# Patient Record
Sex: Female | Born: 1965 | Race: White | Hispanic: No | Marital: Single | Smoking: Never smoker
Health system: Southern US, Community
[De-identification: ages and names within clinical notes are randomized; demographics above are authoritative.]

## PROBLEM LIST (undated history)

## (undated) HISTORY — PX: WISDOM TOOTH EXTRACTION: SHX21

## (undated) HISTORY — PX: INGUINAL HERNIA REPAIR: SUR1180

## (undated) HISTORY — PX: AUGMENTATION MAMMAPLASTY: SUR837

---

## 2010-03-04 ENCOUNTER — Ambulatory Visit (HOSPITAL_COMMUNITY): Admission: RE | Admit: 2010-03-04 | Discharge: 2010-03-04 | Payer: Self-pay | Admitting: Diagnostic Radiology

## 2010-04-14 ENCOUNTER — Encounter: Admission: RE | Admit: 2010-04-14 | Discharge: 2010-04-14 | Payer: Self-pay | Admitting: Obstetrics and Gynecology

## 2010-08-02 ENCOUNTER — Encounter: Payer: Self-pay | Admitting: Obstetrics and Gynecology

## 2013-11-20 ENCOUNTER — Encounter: Payer: Self-pay | Admitting: Obstetrics & Gynecology

## 2013-12-18 ENCOUNTER — Encounter: Payer: Self-pay | Admitting: Obstetrics & Gynecology

## 2013-12-18 DIAGNOSIS — Z0000 Encounter for general adult medical examination without abnormal findings: Secondary | ICD-10-CM

## 2014-02-28 ENCOUNTER — Ambulatory Visit (INDEPENDENT_AMBULATORY_CARE_PROVIDER_SITE_OTHER): Payer: BC Managed Care – PPO | Admitting: Obstetrics & Gynecology

## 2014-02-28 ENCOUNTER — Encounter: Payer: Self-pay | Admitting: Obstetrics & Gynecology

## 2014-02-28 VITALS — BP 116/75 | HR 81 | Resp 16 | Ht 67.0 in | Wt 145.0 lb

## 2014-02-28 DIAGNOSIS — Z1151 Encounter for screening for human papillomavirus (HPV): Secondary | ICD-10-CM | POA: Diagnosis not present

## 2014-02-28 DIAGNOSIS — Z0000 Encounter for general adult medical examination without abnormal findings: Secondary | ICD-10-CM

## 2014-02-28 DIAGNOSIS — Z124 Encounter for screening for malignant neoplasm of cervix: Secondary | ICD-10-CM

## 2014-02-28 DIAGNOSIS — Z01419 Encounter for gynecological examination (general) (routine) without abnormal findings: Secondary | ICD-10-CM

## 2014-02-28 NOTE — Progress Notes (Signed)
Subjective:    Gina Chan is a 48 y.o.SW P2(27 and 88 yo kids) female who presents for an annual exam. The patient has no complaints today. The patient is not currently sexually active for the last 2-3 months. GYN screening history: last pap: was normal. The patient wears seatbelts: yes. The patient participates in regular exercise: yes. Has the patient ever been transfused or tattooed?: no. The patient reports that there is not domestic violence in her life.   Menstrual History: OB History   Grav Para Term Preterm Abortions TAB SAB Ect Mult Living   '2 2 2       2      ' Menarche age: 7  Patient's last menstrual period was 02/19/2014.    The following portions of the patient's history were reviewed and updated as appropriate: allergies, current medications, past family history, past medical history, past social history, past surgical history and problem list.  Review of Systems A comprehensive review of systems was negative.    Objective:    BP 116/75  Pulse 81  Resp 16  Ht '5\' 7"'  (1.702 m)  Wt 145 lb (65.772 kg)  BMI 22.71 kg/m2  LMP 02/19/2014  General Appearance:    Alert, cooperative, no distress, appears stated age  Head:    Normocephalic, without obvious abnormality, atraumatic  Eyes:    PERRL, conjunctiva/corneas clear, EOM's intact, fundi    benign, both eyes  Ears:    Normal TM's and external ear canals, both ears  Nose:   Nares normal, septum midline, mucosa normal, no drainage    or sinus tenderness  Throat:   Lips, mucosa, and tongue normal; teeth and gums normal  Neck:   Supple, symmetrical, trachea midline, no adenopathy;    thyroid:  no enlargement/tenderness/nodules; no carotid   bruit or JVD  Back:     Symmetric, no curvature, ROM normal, no CVA tenderness  Lungs:     Clear to auscultation bilaterally, respirations unlabored  Chest Wall:    No tenderness or deformity   Heart:    Regular rate and rhythm, S1 and S2 normal, no murmur, rub   or gallop  Breast  Exam:    No tenderness, masses, or nipple abnormality  Abdomen:     Soft, non-tender, bowel sounds active all four quadrants,    no masses, no organomegaly  Genitalia:    Normal female without lesion, discharge or tenderness     Extremities:   Extremities normal, atraumatic, no cyanosis or edema  Pulses:   2+ and symmetric all extremities  Skin:   Skin color, texture, turgor normal, no rashes or lesions  Lymph nodes:   Cervical, supraclavicular, and axillary nodes normal  Neurologic:   CNII-XII intact, normal strength, sensation and reflexes    throughout  .    Assessment:    Healthy female exam.    Plan:     Breast self exam technique reviewed and patient encouraged to perform self-exam monthly. She plans to do the breast thermography and does not want BRCA testing  Pap with cotesting

## 2014-03-04 LAB — CYTOLOGY - PAP

## 2014-05-13 ENCOUNTER — Encounter: Payer: Self-pay | Admitting: Obstetrics & Gynecology

## 2015-03-19 ENCOUNTER — Other Ambulatory Visit (HOSPITAL_COMMUNITY): Payer: Self-pay | Admitting: Obstetrics & Gynecology

## 2015-03-19 DIAGNOSIS — Z1231 Encounter for screening mammogram for malignant neoplasm of breast: Secondary | ICD-10-CM

## 2015-04-16 ENCOUNTER — Ambulatory Visit: Payer: BLUE CROSS/BLUE SHIELD

## 2015-04-16 DIAGNOSIS — Z1231 Encounter for screening mammogram for malignant neoplasm of breast: Secondary | ICD-10-CM

## 2016-02-24 ENCOUNTER — Ambulatory Visit: Payer: BLUE CROSS/BLUE SHIELD | Admitting: Obstetrics & Gynecology

## 2016-03-23 ENCOUNTER — Ambulatory Visit: Payer: BLUE CROSS/BLUE SHIELD | Admitting: Obstetrics and Gynecology

## 2017-03-29 ENCOUNTER — Ambulatory Visit (INDEPENDENT_AMBULATORY_CARE_PROVIDER_SITE_OTHER): Payer: Managed Care, Other (non HMO) | Admitting: Sports Medicine

## 2017-03-29 ENCOUNTER — Encounter: Payer: Self-pay | Admitting: Sports Medicine

## 2017-03-29 ENCOUNTER — Ambulatory Visit
Admission: RE | Admit: 2017-03-29 | Discharge: 2017-03-29 | Disposition: A | Discharge status: Home / Self Care | Payer: Managed Care, Other (non HMO) | Source: Ambulatory Visit | Attending: Sports Medicine | Admitting: Sports Medicine

## 2017-03-29 VITALS — BP 100/60 | Ht 67.0 in | Wt 138.0 lb

## 2017-03-29 DIAGNOSIS — M79671 Pain in right foot: Secondary | ICD-10-CM | POA: Diagnosis not present

## 2017-03-30 NOTE — Progress Notes (Signed)
   Subjective:    Patient ID: Gina Chan, female    DOB: 18-Jul-1965, 51 y.o.   MRN: [id number]  HPI chief complaint: Right foot pain  51 year old female comes in today complaining of 2 months of right foot pain. Pain began after she injured her foot while hiking. She "stubbed" her great toe on her right foot. Has had pain ever since. She initially had some swelling diffusely around the MTP joint but that has improved. She localizes all of her pain to the plantar aspect of the first MTP joint. She denies pain on the dorsum of the foot. Pain is worse with walking, specifically with toe off. She's tried several topical anti-inflammatories but they have not been helpful. 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 She has recently changed her footwear to wearing tennis shoes more often and that has helped somewhat. She's been able to stay off of her feet a little more the past several days and that has made a tremendous difference. She denies numbness or tingling.  Past medical history is reviewed Medications are reviewed Allergies are reviewed    Review of Systems    as above Objective:   Physical Exam  Well-developed, well-nourished. No acute distress. Awake alert and oriented 3. Vital signs reviewed  Right foot: Moderate bunion deformity. Good active and passive range of motion at the MTP joint. No tenderness to palpation across the dorsum of the MTP joint. She is most tender to palpation over both medial and lateral sesamoids on the plantar aspect of the MTP joint. No pain with resisted great toe flexion. No soft tissue swelling. No joint effusion. Neurovascularly intact distally. Walking without significant limp.  Brief bedside ultrasound shows no effusion in the first MTP joint. There is fluid surrounding both sesamoids.  X-rays of the right foot including AP, lateral, and oblique views show no evidence of first MTP arthropathy. No fracture. 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 No soft tissue swelling.      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 I gave her a dancer's pad and explained to her how to "float" the first metatarsal head. She may return to the office in a couple of days with her tennis shoes. I recommended icing as needed. She will try to stay off of her foot as much as possible. If symptoms persist then I would recommend further diagnostic imaging in the form of an MRI specifically to rule out an occult sesamoid fracture. If symptoms are not improving over the next 2-3 weeks she will let me know. Otherwise, follow-up as needed.

## 2018-02-07 ENCOUNTER — Other Ambulatory Visit (HOSPITAL_COMMUNITY): Payer: Self-pay | Admitting: Obstetrics & Gynecology

## 2018-02-07 DIAGNOSIS — Z1239 Encounter for other screening for malignant neoplasm of breast: Secondary | ICD-10-CM

## 2018-02-15 ENCOUNTER — Encounter (INDEPENDENT_AMBULATORY_CARE_PROVIDER_SITE_OTHER): Payer: Self-pay

## 2018-02-15 ENCOUNTER — Ambulatory Visit (INDEPENDENT_AMBULATORY_CARE_PROVIDER_SITE_OTHER): Payer: Managed Care, Other (non HMO)

## 2018-02-15 DIAGNOSIS — Z1231 Encounter for screening mammogram for malignant neoplasm of breast: Secondary | ICD-10-CM

## 2018-02-15 DIAGNOSIS — Z1239 Encounter for other screening for malignant neoplasm of breast: Secondary | ICD-10-CM

## 2018-10-25 IMAGING — CR DG FOOT COMPLETE 3+V*R*
3 series · 3 of 3 positions shown · non-contrast
Comparison: None.

CLINICAL DATA: Right foot pain since December 2016 when the patient
suffered a blow to the foot. Pain is centered about the first ray.
Initial encounter.

EXAM:
RIGHT FOOT COMPLETE - 3+ VIEW

[t foot ap right]
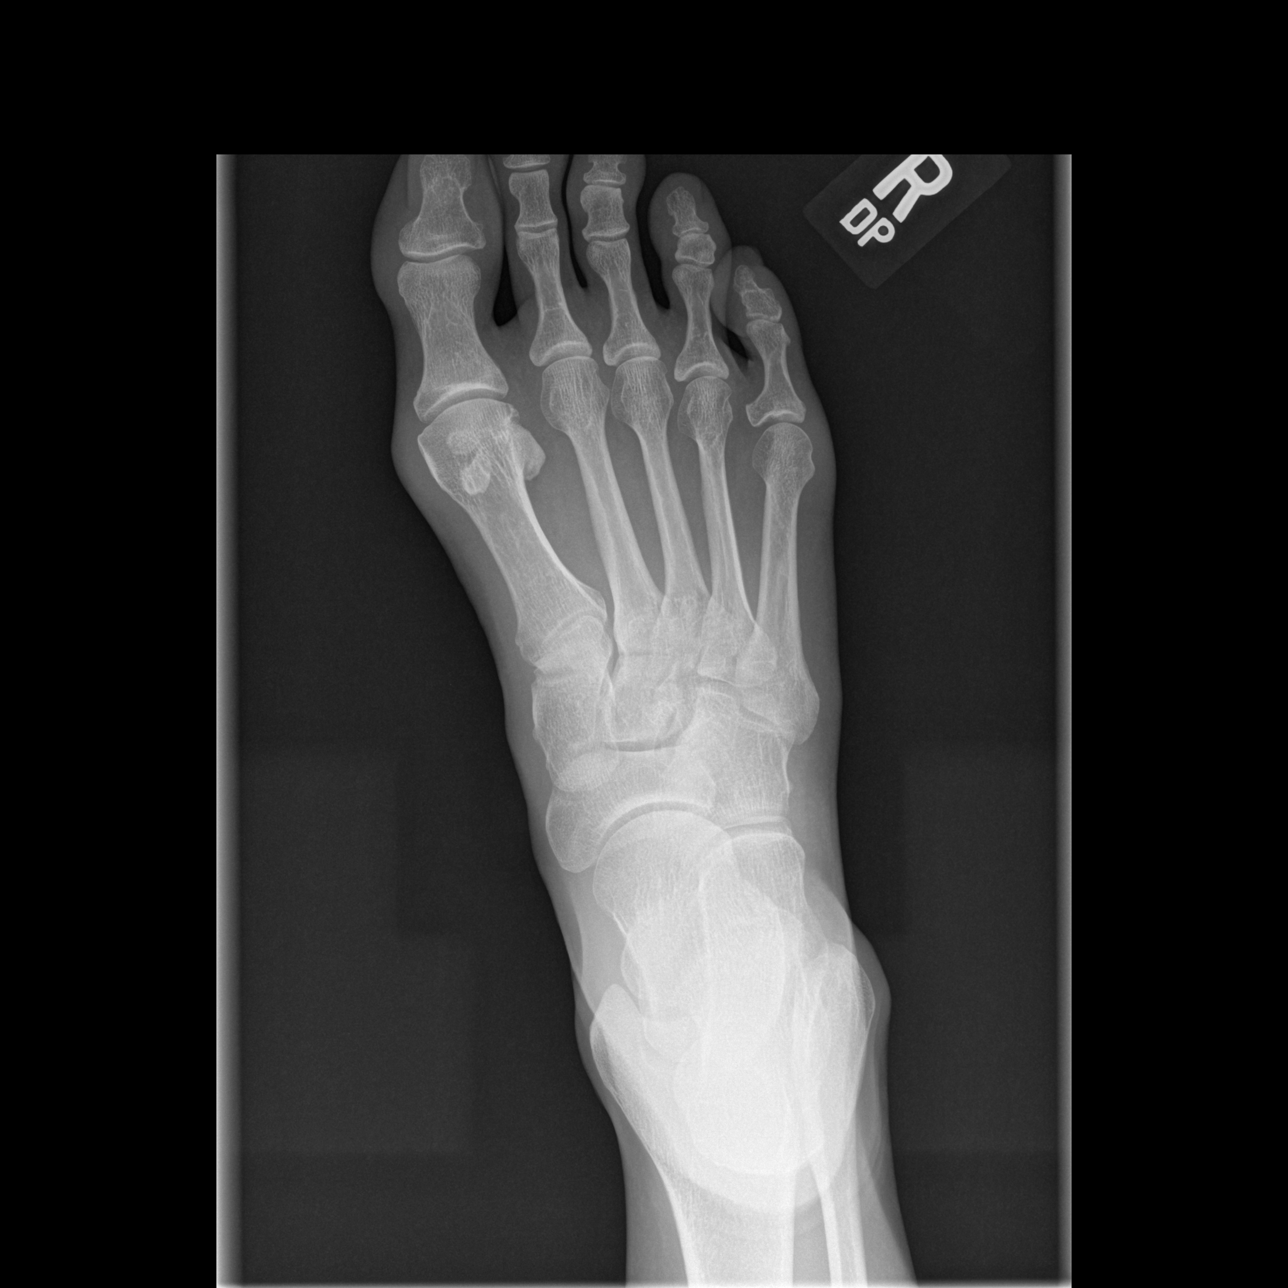

[t foot oblique right]
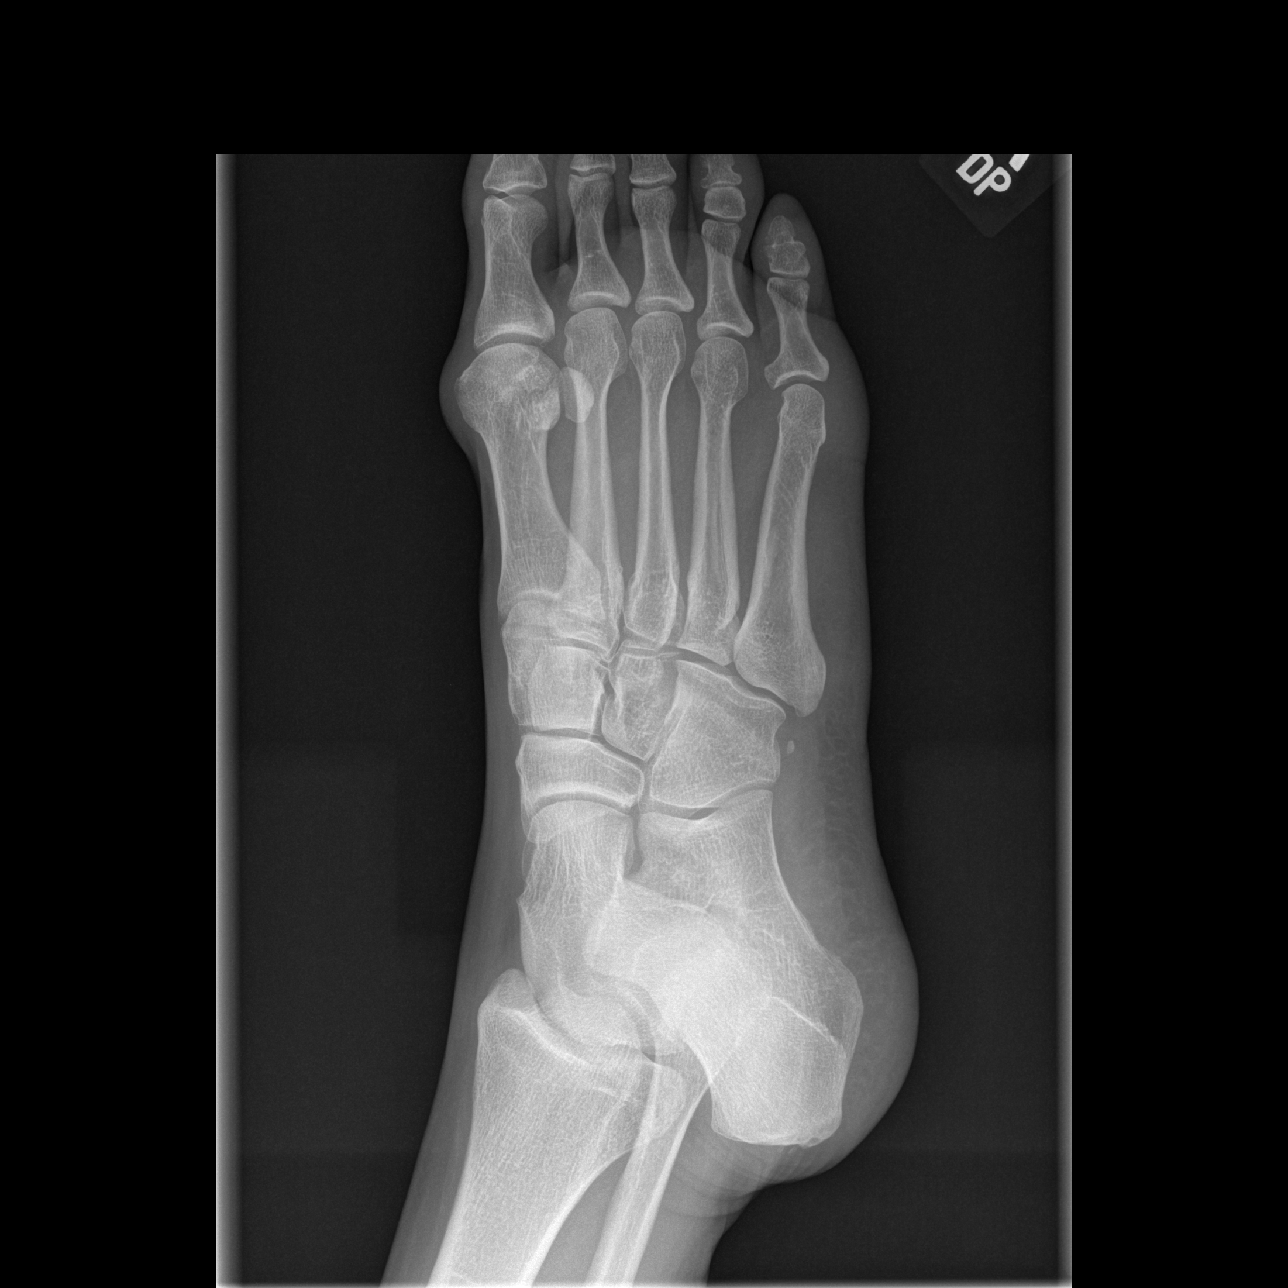

[t foot lat right]
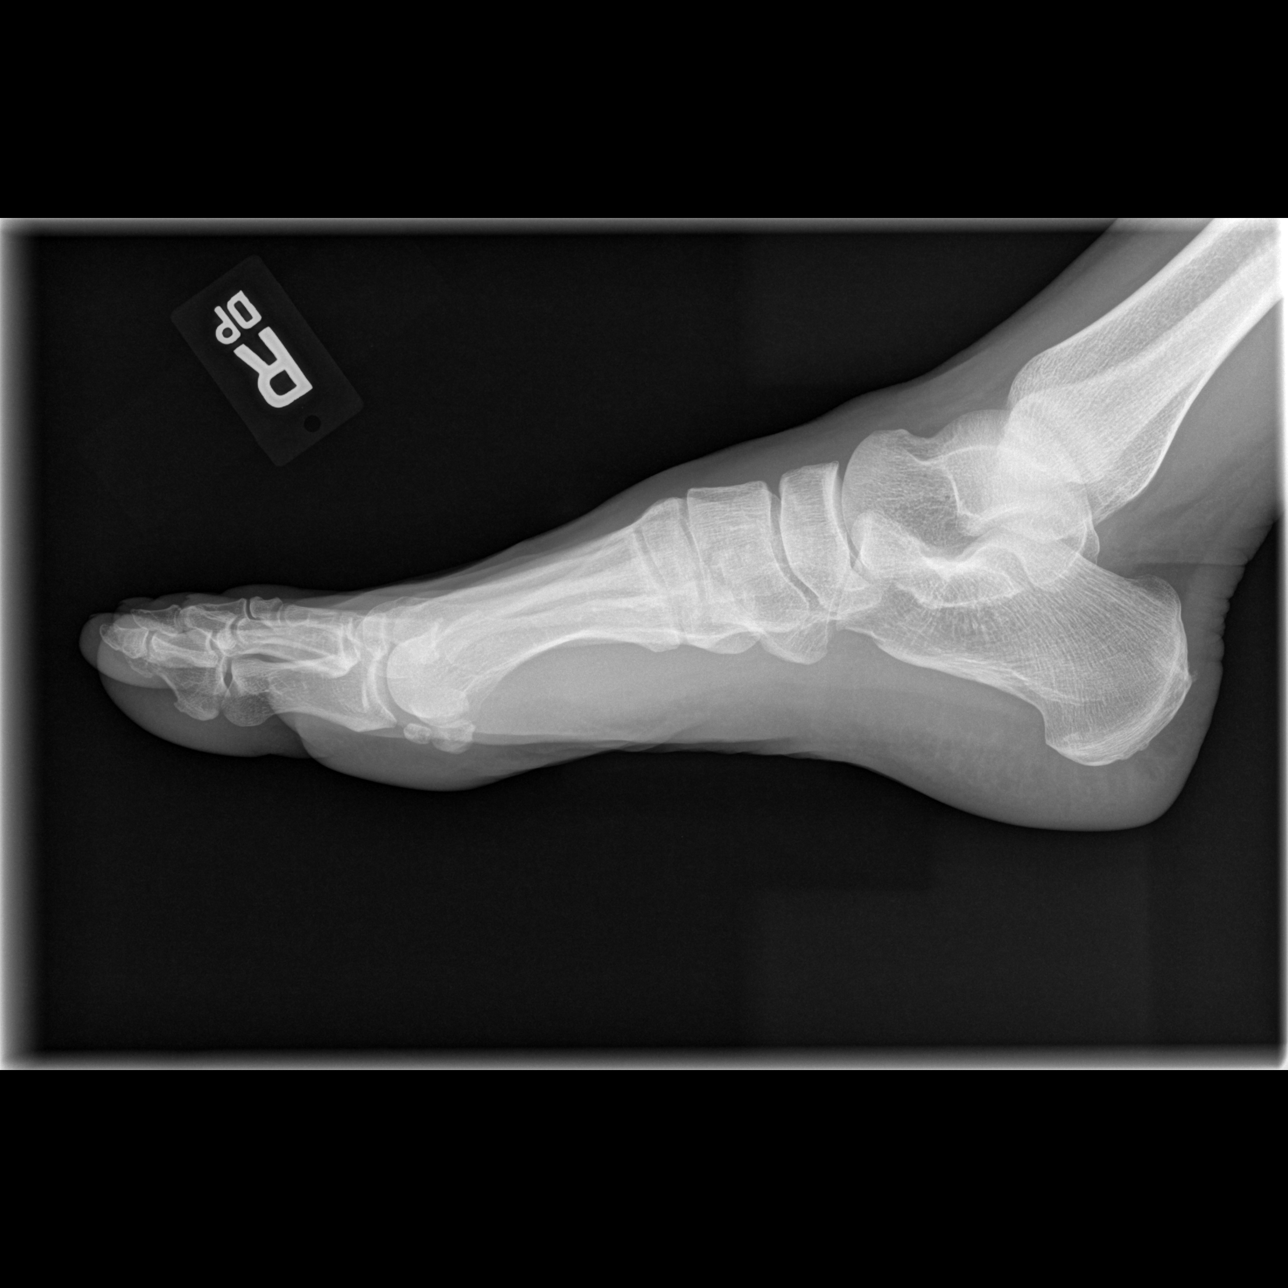

[3 of 3 positions shown; findings below may reference images not displayed]

FINDINGS: There is no evidence of fracture or dislocation. There is no
evidence of arthropathy or other focal bone abnormality. Bipartite
medial sesamoid is noted. Soft tissues are unremarkable.
IMPRESSION: Normal exam.

## 2019-08-13 ENCOUNTER — Other Ambulatory Visit: Payer: Self-pay | Admitting: Obstetrics & Gynecology

## 2019-08-13 DIAGNOSIS — Z1231 Encounter for screening mammogram for malignant neoplasm of breast: Secondary | ICD-10-CM

## 2019-09-12 ENCOUNTER — Other Ambulatory Visit: Payer: Self-pay

## 2019-09-12 ENCOUNTER — Ambulatory Visit (INDEPENDENT_AMBULATORY_CARE_PROVIDER_SITE_OTHER): Payer: Managed Care, Other (non HMO)

## 2019-09-12 DIAGNOSIS — Z1231 Encounter for screening mammogram for malignant neoplasm of breast: Secondary | ICD-10-CM | POA: Diagnosis not present

## 2020-08-12 HISTORY — PX: AUGMENTATION MAMMAPLASTY: SUR837

## 2020-09-04 ENCOUNTER — Other Ambulatory Visit: Payer: Self-pay | Admitting: Obstetrics and Gynecology

## 2020-09-04 DIAGNOSIS — Z1231 Encounter for screening mammogram for malignant neoplasm of breast: Secondary | ICD-10-CM

## 2020-09-19 ENCOUNTER — Ambulatory Visit (INDEPENDENT_AMBULATORY_CARE_PROVIDER_SITE_OTHER): Payer: Self-pay

## 2020-09-19 ENCOUNTER — Other Ambulatory Visit: Payer: Self-pay

## 2020-09-19 DIAGNOSIS — Z1231 Encounter for screening mammogram for malignant neoplasm of breast: Secondary | ICD-10-CM

## 2021-04-09 IMAGING — MG DIGITAL SCREENING BREAST BILAT IMPLANT W/ TOMO W/ CAD
8 of 12 series · 8 of 28 positions shown · non-contrast
Comparison: Previous exam(s).

CLINICAL DATA: Screening.

EXAM:
DIGITAL SCREENING BILATERAL MAMMOGRAM WITH IMPLANTS, CAD AND TOMO
The patient has retropectoral implants. Standard and implant
displaced views were performed.

[R CC]
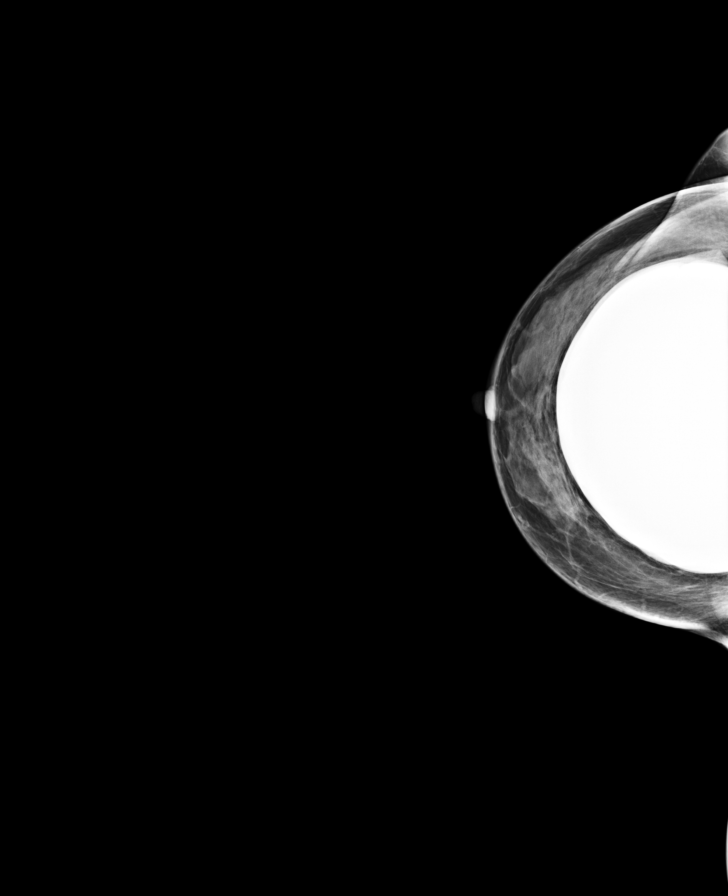

[R MLO]
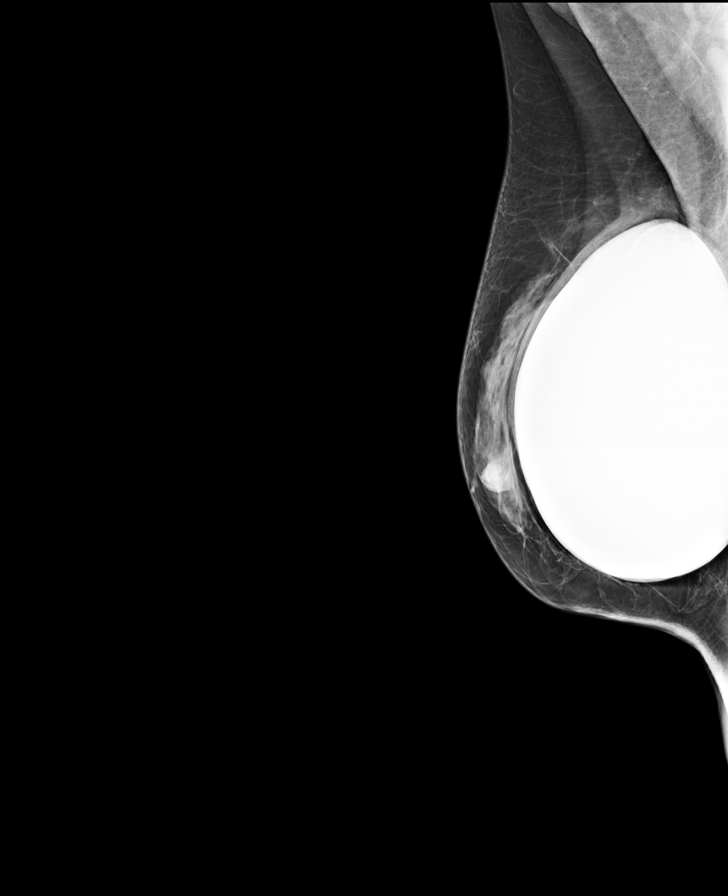

[L MLO]
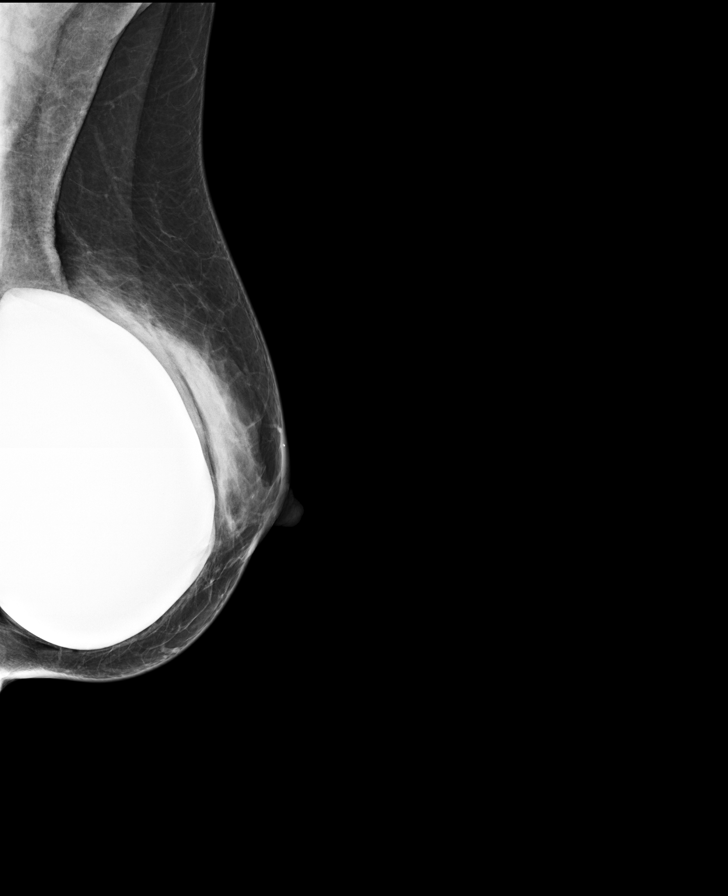

[L CC]
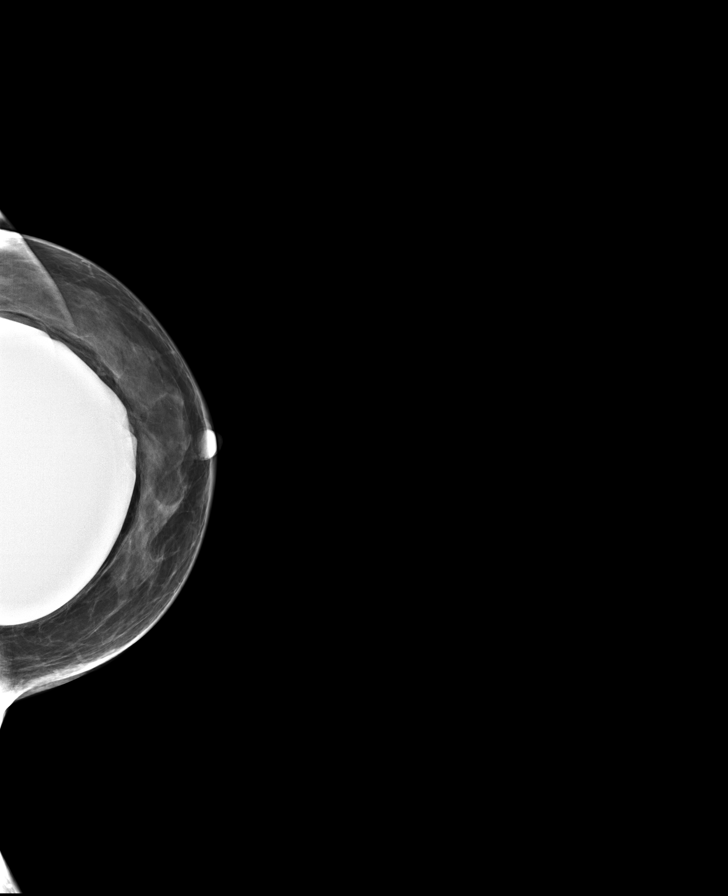

[L MLO synth-2D]
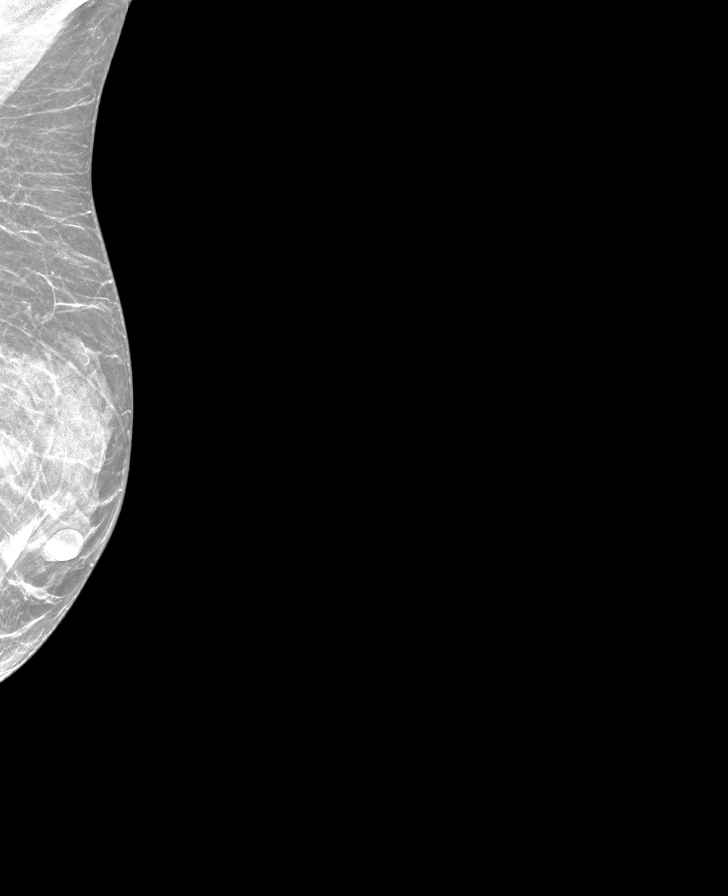

[R CC synth-2D]
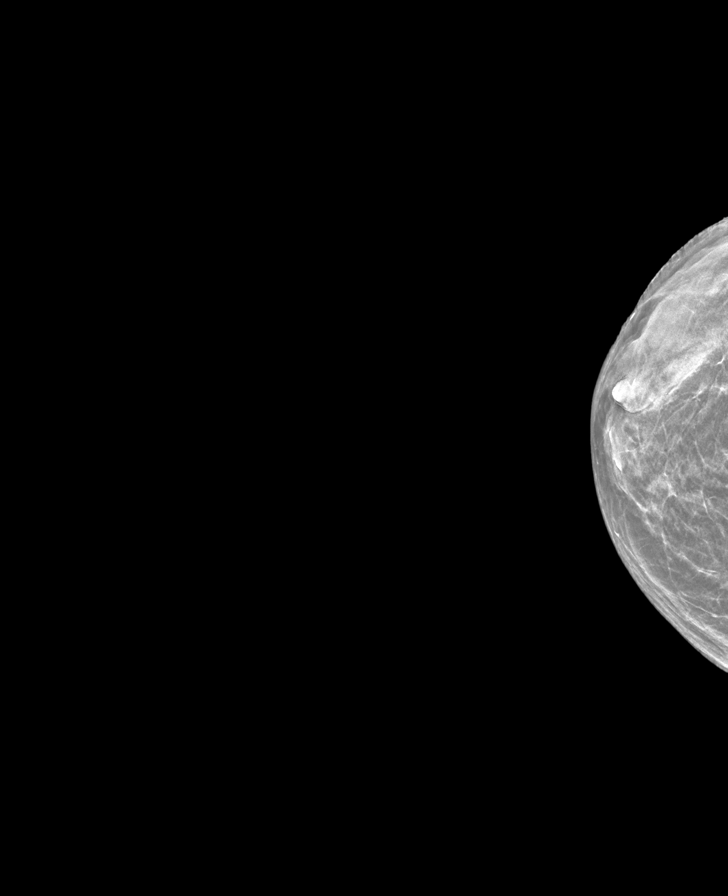

[R MLO synth-2D]
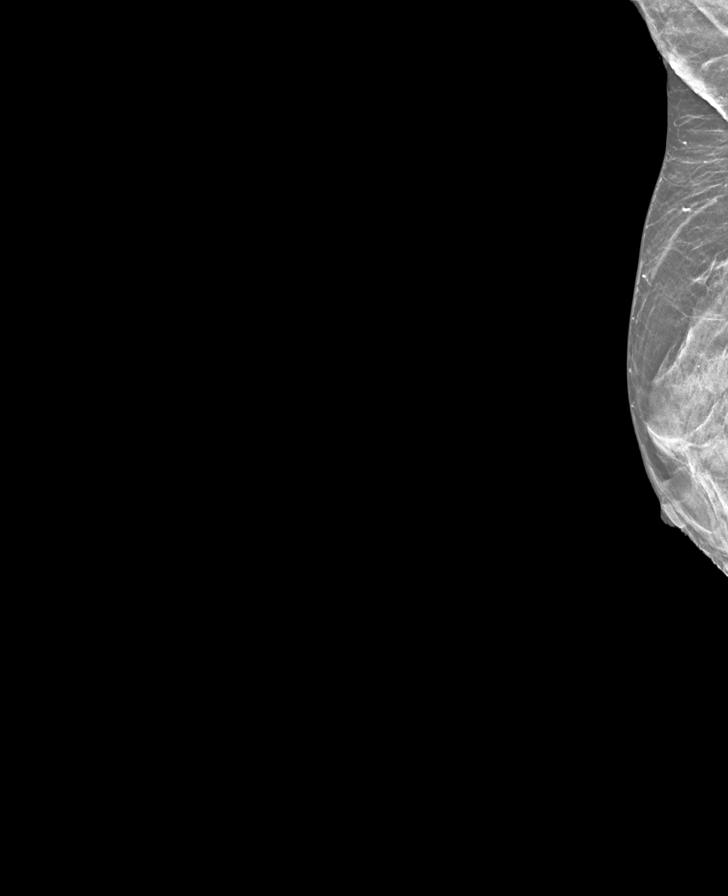

[L CC synth-2D]
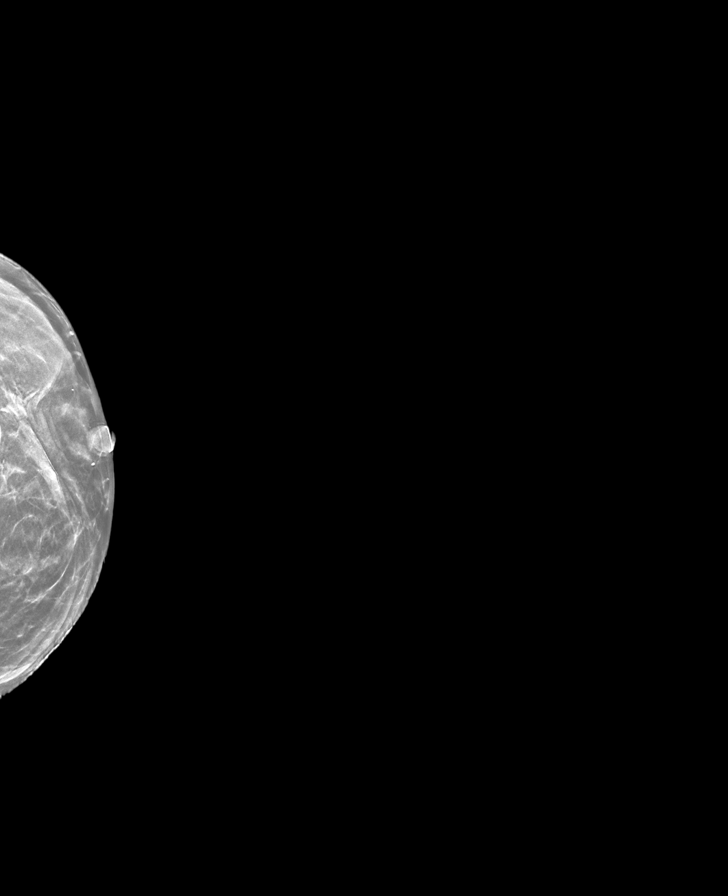

[8 of 28 positions shown; findings below may reference images not displayed]

ACR Breast Density Category c: The breast tissue is heterogeneously
dense, which may obscure small masses.
FINDINGS: There are no findings suspicious for malignancy. Images were
processed with CAD.
IMPRESSION: No mammographic evidence of malignancy. A result letter of this
screening mammogram will be mailed directly to the patient.

RECOMMENDATION:
Screening mammogram in one year. (Code:49-X-OQ9)

BI-RADS CATEGORY  1:  Negative.

## 2022-03-29 ENCOUNTER — Telehealth: Payer: Self-pay | Admitting: Medical Oncology

## 2022-03-29 NOTE — Telephone Encounter (Signed)
Self referral for new pt appt for " High Lymphocyte" results x 3 separate blood tests and Eosinophils "640".  Resulted by [facility]. These labs were ordered from a provider "that lives in [place] ,but he comes to [place] "to see her.   This doctor recommended she get  appt with hematologist . She said she will mail these lab results to Dr. Julien Nordmann.   She called Dr Hart Robinsons today ,  [facility] @ [facility] and asked for an appt also and to order the tests  again.  She lives in [place] and is self pay.

## 2022-04-05 NOTE — Telephone Encounter (Signed)
Letter received with copy of pts lab report. This has been placed on Dr. Worthy Flank desk for review.

## 2022-04-07 ENCOUNTER — Telehealth: Payer: Self-pay | Admitting: Internal Medicine

## 2022-04-07 ENCOUNTER — Telehealth: Payer: Self-pay | Admitting: Medical Oncology

## 2022-04-07 ENCOUNTER — Encounter: Payer: Self-pay | Admitting: Internal Medicine

## 2022-04-07 NOTE — Telephone Encounter (Signed)
Scheduled appt per 9/27 chart review msg from [place]. Pt is aware of appt date and time. Pt is aware to arrive 15 mins prior to appt time and to bring and updated insurance card. Pt is aware of appt location.

## 2022-04-07 NOTE — Telephone Encounter (Signed)
Pt does not have an appt with Dr Sherral Hammers. She wants to see [facility]. New pt scheduler notified.

## 2022-04-26 ENCOUNTER — Inpatient Hospital Stay: Payer: Self-pay

## 2022-04-26 ENCOUNTER — Encounter: Payer: Self-pay | Admitting: Internal Medicine

## 2022-04-26 ENCOUNTER — Inpatient Hospital Stay: Payer: Self-pay | Attending: Internal Medicine | Admitting: Internal Medicine

## 2022-04-26 ENCOUNTER — Other Ambulatory Visit: Payer: Self-pay | Admitting: Medical Oncology

## 2022-04-26 DIAGNOSIS — Z803 Family history of malignant neoplasm of breast: Secondary | ICD-10-CM | POA: Insufficient documentation

## 2022-04-26 DIAGNOSIS — D72820 Lymphocytosis (symptomatic): Secondary | ICD-10-CM | POA: Insufficient documentation

## 2022-04-26 DIAGNOSIS — D72829 Elevated white blood cell count, unspecified: Secondary | ICD-10-CM

## 2022-04-26 DIAGNOSIS — F17290 Nicotine dependence, other tobacco product, uncomplicated: Secondary | ICD-10-CM

## 2022-04-26 DIAGNOSIS — F17200 Nicotine dependence, unspecified, uncomplicated: Secondary | ICD-10-CM | POA: Insufficient documentation

## 2022-04-26 LAB — CBC WITH DIFFERENTIAL (CANCER CENTER ONLY)
Abs Immature Granulocytes: 0.02 10*3/uL (ref 0.00–0.07)
Basophils Absolute: 0.1 10*3/uL (ref 0.0–0.1)
Basophils Relative: 1 %
Eosinophils Absolute: 0.2 10*3/uL (ref 0.0–0.5)
Eosinophils Relative: 2 %
HCT: 42.3 % (ref 36.0–46.0)
Hemoglobin: 14.5 g/dL (ref 12.0–15.0)
Immature Granulocytes: 0 %
Lymphocytes Relative: 28 %
Lymphs Abs: 2.8 10*3/uL (ref 0.7–4.0)
MCH: 32.9 pg (ref 26.0–34.0)
MCHC: 34.3 g/dL (ref 30.0–36.0)
MCV: 95.9 fL (ref 80.0–100.0)
Monocytes Absolute: 0.6 10*3/uL (ref 0.1–1.0)
Monocytes Relative: 6 %
Neutro Abs: 6.5 10*3/uL (ref 1.7–7.7)
Neutrophils Relative %: 63 %
Platelet Count: 243 10*3/uL (ref 150–400)
RBC: 4.41 MIL/uL (ref 3.87–5.11)
RDW: 11.8 % (ref 11.5–15.5)
WBC Count: 10.3 10*3/uL (ref 4.0–10.5)
nRBC: 0 % (ref 0.0–0.2)

## 2022-04-26 LAB — CMP (CANCER CENTER ONLY)
ALT: 13 U/L (ref 0–44)
AST: 18 U/L (ref 15–41)
Albumin: 4.3 g/dL (ref 3.5–5.0)
Alkaline Phosphatase: 41 U/L (ref 38–126)
Anion gap: 5 (ref 5–15)
BUN: 14 mg/dL (ref 6–20)
CO2: 31 mmol/L (ref 22–32)
Calcium: 9.4 mg/dL (ref 8.9–10.3)
Chloride: 103 mmol/L (ref 98–111)
Creatinine: 0.99 mg/dL (ref 0.44–1.00)
GFR, Estimated: >60 mL/min (ref >60)
Glucose, Bld: 87 mg/dL (ref 70–99)
Potassium: 3.8 mmol/L (ref 3.5–5.1)
Sodium: 139 mmol/L (ref 135–145)
Total Bilirubin: 0.5 mg/dL (ref 0.3–1.2)
Total Protein: 6.6 g/dL (ref 6.5–8.1)

## 2022-04-26 NOTE — Progress Notes (Signed)
[facility] [phone]   Fax:([phone]  CONSULT NOTE  REFERRING PHYSICIAN: Self-referred  REASON FOR CONSULTATION:  56 years old white female presented for evaluation of lymphocytosis.  HPI Gina Chan is a 56 y.o. female with no significant past medical history except for inguinal hernia repair as well as breast augmentation.  The patient mentioned that she was not feeling well in June 2023 and she was seen by a physician who comes from [place] every few months and she is currently on progesterone.  She was not feeling well at that time with abdominal pain and headache.  She had several studies performed at that time for evaluation of her condition including CBC that showed elevated lymphocyte count.  Her CBC was repeated twice and continued to have elevated lymphocyte as well as eosinophils.  Her last CBC on 03/25/2022 showed normal total white blood count of 10.0 but she had elevated absolute lymphocyte count of 5060 and absolute eosinophil count of 640.  She has normal hemoglobin of 14.6 and hematocrit 43.1% and normal platelets count of 300,000.  She called the office and requested evaluation for her condition and to rule out any underlying hematological disorder. She is feeling much better now with no significant headache or visual changes.  She has no concerning complaints.  She denied having any chest pain, shortness of breath, cough or hemoptysis.  She denied having any fever or chills.  She has no nausea, vomiting, diarrhea or constipation.  She has no recent weight loss or night sweats. Family history significant for several family members with breast cancer including her sister, mother, maternal aunt and paternal aunt.  The medical history of her father is unknown. The patient is single and has 2 children a son and daughter.  She was accompanied by her daughter Gina Chan today.  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  She  smokes socially when she drinks few times a week.  She has no history of drug abuse.  HPI  No past medical history on file.  Past Surgical History:  Procedure Laterality Date   AUGMENTATION MAMMAPLASTY     AUGMENTATION MAMMAPLASTY Bilateral 08/2020   Pt had silicone implants replaced, placed behind muscle   INGUINAL HERNIA REPAIR     WISDOM TOOTH EXTRACTION      Family History  Problem Relation Age of Onset   Cancer Sister        breast   Cancer Maternal Aunt        breast   Cancer Paternal Aunt        breast    Social History Social History   Tobacco Use   Smoking status: Never   Smokeless tobacco: Never  Substance Use Topics   Alcohol use: Yes    Comment: wine   Drug use: No    No Known Allergies  No current outpatient medications on file.   No current facility-administered medications for this visit.    Review of Systems  Constitutional: negative Eyes: negative Ears, nose, mouth, throat, and face: negative Respiratory: negative Cardiovascular: negative Gastrointestinal: negative Genitourinary:negative Integument/breast: negative Hematologic/lymphatic: negative Musculoskeletal:negative Neurological: negative Behavioral/Psych: negative Endocrine: negative Allergic/Immunologic: negative  Physical Exam  TUU:EKCMK, healthy, no distress, well nourished, well developed, and anxious SKIN: skin color, texture, turgor are normal, no rashes or significant lesions HEAD: Normocephalic, No masses, lesions, tenderness or abnormalities EYES: normal, PERRLA, Conjunctiva are pink and non-injected EARS: External ears normal, Canals clear  OROPHARYNX:no exudate, no erythema, and lips, buccal mucosa, and tongue normal  NECK: supple, no adenopathy, no JVD LYMPH:  no palpable lymphadenopathy, no hepatosplenomegaly BREAST:not examined LUNGS: clear to auscultation , and palpation HEART: regular rate & rhythm, no murmurs, and no gallops ABDOMEN:abdomen soft,  non-tender, normal bowel sounds, and no masses or organomegaly BACK: Back symmetric, no curvature., No CVA tenderness EXTREMITIES:no joint deformities, effusion, or inflammation, no edema  NEURO: alert & oriented x 3 with fluent speech, no focal motor/sensory deficits  PERFORMANCE STATUS: ECOG 0  LABORATORY DATA: Lab Results  Component Value Date   WBC 10.3 04/26/2022   HGB 14.5 04/26/2022   HCT 42.3 04/26/2022   MCV 95.9 04/26/2022   PLT 243 04/26/2022      Chemistry      Component Value Date/Time   NA 139 04/26/2022 1132   K 3.8 04/26/2022 1132   CL 103 04/26/2022 1132   CO2 31 04/26/2022 1132   BUN 14 04/26/2022 1132   CREATININE 0.99 04/26/2022 1132      Component Value Date/Time   CALCIUM 9.4 04/26/2022 1132   ALKPHOS 41 04/26/2022 1132   AST 18 04/26/2022 1132   ALT 13 04/26/2022 1132   BILITOT 0.5 04/26/2022 1132       RADIOGRAPHIC STUDIES: No results found.  ASSESSMENT: This is a very pleasant 56 years old white female presented for evaluation of lymphocytosis and eosinophilia that was seen on previous blood work several weeks ago.  The patient was sick at that time with abdominal pain and headache and these were likely reactive in nature to a viral infection or other inflammatory process.   PLAN: I had a lengthy discussion with the patient today about her condition. I order repeat CBC today that showed no significant abnormalities and resolution of her abnormalities of the lymphocyte and eosinophil. I explained to the patient that this is likely was inflammatory in origin and it is currently resolved and there is no need for any additional investigation at this point. I recommended for the patient to continue her routine follow-up visit and evaluation by her primary care provider. I also explained to the patient that I am available for her if she has any concerning abnormalities in the future. She was advised to call immediately if she has any concerning  issues. The patient voices understanding of current disease status and treatment options and is in agreement with the current care plan.  All questions were answered. The patient knows to call the clinic with any problems, questions or concerns. We can certainly see the patient much sooner if necessary.  Thank you so much for allowing me to participate in the care of [place]. I will continue to follow up the patient with you and assist in her care.  The total time spent in the appointment was 55 minutes.  Disclaimer: This note was dictated with voice recognition software. Similar sounding words can inadvertently be transcribed and may not be corrected upon review.   Eilleen Kempf April 26, 2022, 11:49 AM

## 2023-04-04 ENCOUNTER — Other Ambulatory Visit: Payer: Self-pay | Admitting: Obstetrics and Gynecology

## 2023-04-04 DIAGNOSIS — Z1231 Encounter for screening mammogram for malignant neoplasm of breast: Secondary | ICD-10-CM

## 2023-04-28 ENCOUNTER — Ambulatory Visit: Payer: Self-pay

## 2023-06-08 ENCOUNTER — Other Ambulatory Visit: Payer: Self-pay | Admitting: Obstetrics and Gynecology

## 2023-06-08 ENCOUNTER — Ambulatory Visit (INDEPENDENT_AMBULATORY_CARE_PROVIDER_SITE_OTHER): Payer: Self-pay

## 2023-06-08 DIAGNOSIS — Z1231 Encounter for screening mammogram for malignant neoplasm of breast: Secondary | ICD-10-CM

## 2023-06-14 ENCOUNTER — Other Ambulatory Visit: Payer: Self-pay | Admitting: Obstetrics and Gynecology

## 2023-06-14 DIAGNOSIS — R928 Other abnormal and inconclusive findings on diagnostic imaging of breast: Secondary | ICD-10-CM

## 2023-07-14 ENCOUNTER — Ambulatory Visit
Admission: RE | Admit: 2023-07-14 | Discharge: 2023-07-14 | Disposition: A | Discharge status: Home / Self Care | Payer: Self-pay | Source: Ambulatory Visit | Attending: Obstetrics and Gynecology | Admitting: Obstetrics and Gynecology

## 2023-07-14 ENCOUNTER — Ambulatory Visit
Admission: RE | Admit: 2023-07-14 | Discharge: 2023-07-14 | Disposition: A | Discharge status: Home / Self Care | Payer: No Typology Code available for payment source | Source: Ambulatory Visit | Attending: Obstetrics and Gynecology | Admitting: Obstetrics and Gynecology

## 2023-07-14 DIAGNOSIS — R928 Other abnormal and inconclusive findings on diagnostic imaging of breast: Secondary | ICD-10-CM

## 2023-07-18 ENCOUNTER — Other Ambulatory Visit: Payer: Self-pay | Admitting: Obstetrics and Gynecology

## 2023-07-18 DIAGNOSIS — N6320 Unspecified lump in the left breast, unspecified quadrant: Secondary | ICD-10-CM

## 2023-07-19 ENCOUNTER — Telehealth: Payer: Self-pay

## 2023-07-19 ENCOUNTER — Other Ambulatory Visit: Payer: Self-pay | Admitting: Family Medicine

## 2023-07-19 ENCOUNTER — Encounter: Payer: Self-pay | Admitting: *Deleted

## 2023-07-19 ENCOUNTER — Telehealth: Payer: Self-pay | Admitting: *Deleted

## 2023-07-19 ENCOUNTER — Other Ambulatory Visit: Payer: No Typology Code available for payment source

## 2023-07-19 DIAGNOSIS — N6320 Unspecified lump in the left breast, unspecified quadrant: Secondary | ICD-10-CM

## 2023-07-19 NOTE — Telephone Encounter (Signed)
 Pt left message on nurse line that her breast center appointment was canceled due to order being denied by provider. Pt has not been seen since 2015. After speaking with Dr.Stinson, he will sign the order for breast center procedure but, requests that the patient schedule an appointment with us  to reestablish care. Pt was upset that a doctor would deny her orders. I explained to the patient that Dr.Stinson didn't approve nor deny the breast center procedure. I explained it was communicated to him that the patient needed authorization from a doctor but, he never received the order and had trouble getting in touch with the breast center. The breast center canceled her appt for 12:45 today due to delay in orders being signed. I attempted to call breast center and have them still see her in that time slot today however, they were unable to do so. I let patient know that Dr.Stinson is signing the orders and that per Breast Center she will need to call them to reschedule. Pt expressed understanding. The patient is scheduled in our office for 1/23 with Dr.Duncan. Pt did state that I could schedule her but she wasn't sure if she would come. I strongly encouraged patient to come to the appointment so we can help manage her care. All of this has been communicated to Dr.Stinson.  SABRA

## 2023-07-19 NOTE — Progress Notes (Signed)
 Message received from Dr Barbra that pt is having a breast clip placed yet hasn't been seen in our office for over 9 years.  I have sent the pt a my chart message confirming that she has been seen elsewhere for GYN or if she is coming back to our office.  She will need an appt since it has been over 9 years.  Message Iva to contact pt as well.

## 2023-07-19 NOTE — Telephone Encounter (Signed)
 Left patient a message to call and schedule New GYN annual with an MD per Dr. Adrian Blackwater. Last seen 08/20/20215.

## 2023-07-19 NOTE — Telephone Encounter (Signed)
-----   Message from Jacob J Stinson sent at 07/18/2023  4:42 PM EST ----- Regarding: Mammogram order Received an order for mammogram order for procedure tomorrow that is needing to be placed. It looks like she will be getting an US  with clip placement. Also, it looks like she hasn't been seen for almost 10 years(!). We should probably get her in to be seen.

## 2023-07-25 ENCOUNTER — Ambulatory Visit
Admission: RE | Admit: 2023-07-25 | Discharge: 2023-07-25 | Disposition: A | Discharge status: Home / Self Care | Payer: No Typology Code available for payment source | Source: Ambulatory Visit | Attending: Family Medicine | Admitting: Family Medicine

## 2023-07-25 DIAGNOSIS — N6320 Unspecified lump in the left breast, unspecified quadrant: Secondary | ICD-10-CM

## 2023-07-25 HISTORY — PX: BREAST BIOPSY: SHX20

## 2023-07-26 LAB — SURGICAL PATHOLOGY

## 2023-08-04 ENCOUNTER — Encounter: Payer: No Typology Code available for payment source | Admitting: Obstetrics and Gynecology

## 2024-03-19 ENCOUNTER — Ambulatory Visit

## 2024-03-19 ENCOUNTER — Encounter: Payer: Self-pay | Admitting: Sports Medicine

## 2024-03-19 ENCOUNTER — Ambulatory Visit (HOSPITAL_BASED_OUTPATIENT_CLINIC_OR_DEPARTMENT_OTHER)
Admission: RE | Admit: 2024-03-19 | Discharge: 2024-03-19 | Disposition: A | Discharge status: Home / Self Care | Payer: Self-pay | Source: Ambulatory Visit | Attending: Sports Medicine | Admitting: Sports Medicine

## 2024-03-19 ENCOUNTER — Ambulatory Visit (INDEPENDENT_AMBULATORY_CARE_PROVIDER_SITE_OTHER): Payer: Self-pay | Admitting: Sports Medicine

## 2024-03-19 VITALS — BP 100/74 | Ht 67.0 in | Wt 137.0 lb

## 2024-03-19 DIAGNOSIS — M5416 Radiculopathy, lumbar region: Secondary | ICD-10-CM | POA: Insufficient documentation

## 2024-03-19 MED ORDER — KETOROLAC TROMETHAMINE 30 MG/ML IJ SOLN
30.0000 mg | Freq: Once | INTRAMUSCULAR | Status: AC
  Administered 2024-03-19: 30 mg via INTRAMUSCULAR

## 2024-03-19 MED ORDER — METHYLPREDNISOLONE ACETATE 40 MG/ML IJ SUSP
40.0000 mg | Freq: Once | INTRAMUSCULAR | Status: AC
  Administered 2024-03-19: 40 mg via INTRA_ARTICULAR

## 2024-03-19 MED ORDER — PREDNISONE 10 MG PO TABS: ORAL_TABLET | ORAL | 0 refills | Status: AC

## 2024-03-19 NOTE — Progress Notes (Signed)
   Subjective:    Patient ID: Gina Chan, female    DOB: 06/07/1966, 58 y.o.   MRN: [id number]  HPI chief complaint: Low back pain  Gina Chan presents with several months of low back pain.  Pain began acutely in March.  She was bent over picking up her dog when she felt her back lock up.  Since that time she has had pain that will wax and wane.  She is also describing some neuropathic pain in the right leg.  She describes it as bees buzzing in my leg.  Pain begins in the right lower leg and will radiate into the right thigh and down past the right knee.  It is constant in nature.  She has tried acupuncture and chiropractic treatment.  She has also tried some full body work.  She had a few leftover prednisone  which she has recently taken.  That has been slightly helpful.  No pain in the left leg.  She has had x-rays done recently but they were at a chiropractic office and are not available for my review.  Past medical history reviewed Medications reviewed Allergies reviewed  Review of Systems As above    Objective:   Physical Exam  Well-developed, fit appearing.  No acute distress  Lumbar spine: Good lumbar range of motion.  She does have reproducible pain with extension.  Slight tenderness to palpation along the area of the right piriformis.  Negative FABER.  Negative straight leg raise.  She has a slight amount of weakness with resisted foot dorsiflexion and great toe extension on the right compared to the left.  No atrophy.  Good reflexes.  Good pulses.      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  We will give her a 40 mg IM Depo-Medrol  injection as well as a 30 mg IM Toradol  injection.  I will also provide her with a 6-day Sterapred Dosepak to take as directed if she continues to experience pain despite today's injections.  She will also [facility] physical  [facility]rapy at [facility] and will follow-up with me again in 3 weeks.  I would like to get AP and lateral views of her lumbar spine and I will call her in the morning to discuss those findings.  If symptoms persist at follow-up she may need further diagnostic imaging in the form of an MRI.  This note was dictated using Dragon naturally speaking software and may contain errors in syntax, spelling, or content which have not been identified prior to signing this note.   Addendum: X-rays reviewed and discussed with patient.  She has some mild degenerative changes at L1-L3.  Mild facet changes at L4-S1.  SI joints are unremarkable.  She will start physical therapy as scheduled.  If symptoms persist consider MRI.

## 2024-03-20 ENCOUNTER — Ambulatory Visit: Admitting: Sports Medicine

## 2024-04-09 ENCOUNTER — Ambulatory Visit

## 2024-04-09 NOTE — Progress Notes (Deleted)
   Subjective:    Patient ID: Gina Chan, female    DOB: 58 y.o., 1965/08/27   MRN: [id number]  HPI  Chief Complaint: ***  ***     Objective:   Physical Exam There were no vitals filed for this visit.  ***       Assessment & Plan:   Gina Chan is a 58 y.o. ***
